# Patient Record
Sex: Male | Born: 1990 | Race: Black or African American | Hispanic: No | Marital: Single | Smoking: Current every day smoker
Health system: Southern US, Community
[De-identification: ages and names within clinical notes are randomized; demographics above are authoritative.]

## PROBLEM LIST (undated history)

## (undated) HISTORY — PX: SURGERY SCROTAL / TESTICULAR: SUR1316

## (undated) HISTORY — PX: TONSILLECTOMY: SUR1361

---

## 2016-02-16 ENCOUNTER — Emergency Department (HOSPITAL_COMMUNITY)
Admission: EM | Admit: 2016-02-16 | Discharge: 2016-02-16 | Disposition: A | Discharge status: Home / Self Care | Payer: Self-pay | Attending: Emergency Medicine | Admitting: Emergency Medicine

## 2016-02-16 ENCOUNTER — Encounter (HOSPITAL_COMMUNITY): Payer: Self-pay | Admitting: Emergency Medicine

## 2016-02-16 ENCOUNTER — Emergency Department (HOSPITAL_COMMUNITY): Payer: Self-pay

## 2016-02-16 DIAGNOSIS — N50819 Testicular pain, unspecified: Secondary | ICD-10-CM | POA: Insufficient documentation

## 2016-02-16 DIAGNOSIS — F17200 Nicotine dependence, unspecified, uncomplicated: Secondary | ICD-10-CM | POA: Insufficient documentation

## 2016-02-16 DIAGNOSIS — R52 Pain, unspecified: Secondary | ICD-10-CM

## 2016-02-16 DIAGNOSIS — Z9889 Other specified postprocedural states: Secondary | ICD-10-CM | POA: Insufficient documentation

## 2016-02-16 LAB — URINALYSIS, ROUTINE W REFLEX MICROSCOPIC
Bilirubin Urine: NEGATIVE
GLUCOSE, UA: NEGATIVE mg/dL
Hgb urine dipstick: NEGATIVE
KETONES UR: NEGATIVE mg/dL
LEUKOCYTES UA: NEGATIVE
Nitrite: NEGATIVE
PH: 7 (ref 5.0–8.0)
Protein, ur: NEGATIVE mg/dL
SPECIFIC GRAVITY, URINE: 1.03 (ref 1.005–1.030)

## 2016-02-16 NOTE — ED Notes (Signed)
Pt c/o sudden onset testicle pain since last night. Pt has hx of "testicles twisting" and has had surgery in the past. Pt A&Ox4 and ambulatory. Pt sts the pain comes and goes but when it comes it is excruciating. Pt c/o bilateral swelling and redness.

## 2016-02-16 NOTE — Discharge Instructions (Signed)
Return here for severe pain your testicles with associated scrotal edema, fever, or any other problems

## 2016-02-16 NOTE — ED Provider Notes (Signed)
CSN: [id number]     Arrival date & time 02/16/16  1718 History   First MD Initiated Contact with Patient 02/16/16 1915     Chief Complaint  Patient presents with  . Testicle Pain     (Consider location/radiation/quality/duration/timing/severity/associated sxs/prior Treatment) HPI Comments: Patient complains of intermittent testicular pain was last for minutes. 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 He is unsure which testicle was repaired. Denies any hematuria or dysuria. No fever or chills. Denies any current swelling. Symptoms occur spontaneously and last for several hours. No treatment use prior to arrival  Patient is a 25 y.o. male presenting with testicular pain. The history is provided by the patient.  Testicle Pain    History reviewed. No pertinent past medical history. Past Surgical History  Procedure Laterality Date  . Tonsillectomy     No family history on file. Social History  Substance Use Topics  . Smoking status: Current Every Day Smoker  . Smokeless tobacco: None  . Alcohol Use: Yes    Review of Systems  Genitourinary: Positive for testicular pain.  All other systems reviewed and are negative.     Allergies  Azithromycin  Home Medications   Prior to Admission medications   Not on File   BP 139/78 mmHg  Pulse 76  Temp(Src) 98.2 F (36.8 C) (Oral)  Resp 16  SpO2 100% Physical Exam  Constitutional: He is oriented to person, place, and time. He appears well-developed and well-nourished.  Non-toxic appearance. No distress.  HENT:  Head: Normocephalic and atraumatic.  Eyes: Conjunctivae, EOM and lids are normal. Pupils are equal, round, and reactive to light.  Neck: Normal range of motion. Neck supple. No tracheal deviation present. No thyroid mass present.  Cardiovascular: Normal rate, regular rhythm and normal heart sounds.  Exam reveals no gallop.   No murmur heard. Pulmonary/Chest: Effort normal and breath  sounds normal. No stridor. No respiratory distress. He has no decreased breath sounds. He has no wheezes. He has no rhonchi. He has no rales.  Abdominal: Soft. Normal appearance and bowel sounds are normal. He exhibits no distension. There is no tenderness. There is no rebound and no CVA tenderness.  Genitourinary: Testes normal. Cremasteric reflex is present. Uncircumcised.  Musculoskeletal: Normal range of motion. He exhibits no edema or tenderness.  Neurological: He is alert and oriented to person, place, and time. He has normal strength. No cranial nerve deficit or sensory deficit. GCS eye subscore is 4. GCS verbal subscore is 5. GCS motor subscore is 6.  Skin: Skin is warm and dry. No abrasion and no rash noted.  Psychiatric: He has a normal mood and affect. His speech is normal and behavior is normal.  Nursing note and vitals reviewed.   ED Course  Procedures (including critical care time) Labs Review Labs Reviewed  URINE CULTURE  URINALYSIS, ROUTINE W REFLEX MICROSCOPIC (NOT AT [facility])    Imaging Review No results found. I have personally reviewed and evaluated these images and lab results as part of my medical decision-making.   EKG Interpretation None      MDM   Final diagnoses:  None   patient's urinalysis is negative here. He has no evidence of torsion on physical exam or by testicular ultrasound. Will be given referral to urology    Lorre Nick, MD 02/16/16 2147

## 2016-02-16 NOTE — ED Notes (Signed)
MD at bedside. 

## 2016-02-16 NOTE — ED Notes (Signed)
Ultrasound at bedside

## 2016-02-18 LAB — URINE CULTURE: CULTURE: NO GROWTH

## 2016-07-14 ENCOUNTER — Emergency Department (HOSPITAL_COMMUNITY)
Admission: EM | Admit: 2016-07-14 | Discharge: 2016-07-14 | Disposition: A | Discharge status: Home / Self Care | Payer: Self-pay | Attending: Emergency Medicine | Admitting: Emergency Medicine

## 2016-07-14 ENCOUNTER — Encounter (HOSPITAL_COMMUNITY): Payer: Self-pay | Admitting: Emergency Medicine

## 2016-07-14 DIAGNOSIS — M79671 Pain in right foot: Secondary | ICD-10-CM | POA: Insufficient documentation

## 2016-07-14 DIAGNOSIS — R21 Rash and other nonspecific skin eruption: Secondary | ICD-10-CM | POA: Insufficient documentation

## 2016-07-14 DIAGNOSIS — F17200 Nicotine dependence, unspecified, uncomplicated: Secondary | ICD-10-CM | POA: Insufficient documentation

## 2016-07-14 MED ORDER — TRIAMCINOLONE ACETONIDE 0.1 % EX CREA: 1.0000 "application " | TOPICAL_CREAM | Freq: Two times a day (BID) | CUTANEOUS | 1 refills | Status: AC

## 2016-07-14 MED ORDER — NAPROXEN 250 MG PO TABS: 250.0000 mg | ORAL_TABLET | Freq: Two times a day (BID) | ORAL | 0 refills | Status: AC

## 2016-07-14 NOTE — ED Triage Notes (Signed)
Pt reports that he was walking through the woods last pm in flip flops and now has red bumps to top of foot as well as pain and pressure when he walks or moves toes. Pt denies injury. Pt is sleepy during assessment and mumbles through triage making it difficult to communicate. Pt is A&O and in NAD. VSS

## 2016-07-14 NOTE — ED Provider Notes (Signed)
[facility]-EMERGENCY DEPT Provider Note   CSN: [id number] Arrival date & time: 07/14/16  0623  First Provider Contact:  First MD Initiated Contact with Patient 07/14/16 [phone]        History   Chief Complaint Chief Complaint  Patient presents with  . Foot Pain    HPI Nathan Esparza is a 25 y.o. male.  Presents to the ED complaining of a rash and pain to the top of his right foot since yesterday. The patient reports he was walking in the woods yesterday and then noticed a red rash that is is painful overlying the top of his right foot. He denies any injury or trauma to his foot. He denies seeing any insects or bugs on his feet. She denies any rashes. He has had nothing for treatment of his symptoms today. He denies fevers, swelling, trauma, falls, numbness, tingling or weakness.   The history is provided by the patient. No language interpreter was used.  Foot Pain  Associated symptoms include arthralgias and a rash. Pertinent negatives include no fever, joint swelling, numbness or weakness.    History reviewed. No pertinent past medical history.  There are no active problems to display for this patient.   Past Surgical History:  Procedure Laterality Date  . SURGERY SCROTAL / TESTICULAR    . TONSILLECTOMY         Home Medications    Prior to Admission medications   Medication Sig Start Date End Date Taking? Authorizing Provider  naproxen (NAPROSYN) 250 MG tablet Take 1 tablet (250 mg total) by mouth 2 (two) times daily with a meal. As needed for pain. 07/14/16   Everlene Farrier, PA-C  triamcinolone cream (KENALOG) 0.1 % Apply 1 application topically 2 (two) times daily. 07/14/16   Everlene Farrier, PA-C    Family History No family history on file.  Social History Social History  Substance Use Topics  . Smoking status: Current Every Day Smoker  . Smokeless tobacco: Never Used  . Alcohol use Yes     Allergies   Azithromycin   Review of Systems Review of Systems    Constitutional: Negative for fever.  Cardiovascular: Negative for leg swelling.  Musculoskeletal: Positive for arthralgias. Negative for gait problem and joint swelling.  Skin: Positive for rash. Negative for pallor and wound.  Neurological: Negative for weakness and numbness.     Physical Exam Updated Vital Signs BP 120/77 (BP Location: Left Arm)   Pulse 85   Temp 97.9 F (36.6 C) (Oral)   Resp 16   SpO2 98%   Physical Exam  Constitutional: He appears well-developed and well-nourished. No distress.  HENT:  Head: Normocephalic and atraumatic.  Eyes: Right eye exhibits no discharge. Left eye exhibits no discharge.  Cardiovascular: Normal rate, regular rhythm and intact distal pulses.   Bilateral dorsalis pedis and posterior tibialis pulses are intact. Good capillary refill.  Pulmonary/Chest: Effort normal. No respiratory distress.  Musculoskeletal: Normal range of motion. He exhibits tenderness. He exhibits no edema or deformity.  Patient is very mild erythema overlying the dorsum of his right foot. There is mild tenderness to palpation. No bony point tenderness. No deformity. Good range of motion of his ankle and toes. No calf edema or tenderness. No vesicles or bulla. No fluctuance or discharge. No evidence of abscess. No evidence of cellulitis. No warmth.  Neurological: He is alert. Coordination normal.  Skin: Skin is warm and dry. Capillary refill takes less than 2 seconds. He is not diaphoretic. No pallor.  See musculoskeletal  Psychiatric: He has a normal mood and affect. His behavior is normal.  Nursing note and vitals reviewed.    ED Treatments / Results  Labs (all labs ordered are listed, but only abnormal results are displayed) Labs Reviewed - No data to display  EKG  EKG Interpretation None       Radiology No results found.  Procedures Procedures (including critical care time)  Medications Ordered in ED Medications - No data to display   Initial  Impression / Assessment and Plan / ED Course  I have reviewed the triage vital signs and the nursing notes.    Clinical Course   Patient presented  complaining of a rash and pain to the top of his right foot since yesterday. The patient reports he was walking in the woods yesterday and then noticed a red rash that is is painful overlying the top of his right foot. He denies any injury or trauma to his foot. He denies seeing any insects or bugs on his feet. She denies any rashes. On examination patient is afebrile nontoxic appearing. His slight erythema overlying the dorsum of his right foot. There is mild tenderness. No vesicles or bulla. No evidence of cellulitis or warmth. He was walking without closed toed shoes in the woods. This possibly from an insect bite or poison ivy. Will start on steroid cream naproxen for pain control. I discussed to watch for signs of cellulitis and discussed these in symptoms. Also encouraged him follow-up with primary care for recheck. I advised the patient to follow-up with their primary care provider this week. I advised the patient to return to the emergency department with new or worsening symptoms or new concerns. The patient verbalized understanding and agreement with plan.    Final Clinical Impressions(s) / ED Diagnoses   Final diagnoses:  Rash and nonspecific skin eruption    New Prescriptions New Prescriptions   NAPROXEN (NAPROSYN) 250 MG TABLET    Take 1 tablet (250 mg total) by mouth 2 (two) times daily with a meal. As needed for pain.   TRIAMCINOLONE CREAM (KENALOG) 0.1 %    Apply 1 application topically 2 (two) times daily.     Everlene Farrier, PA-C 07/14/16 8295    Nelva Nay, MD 07/14/16 [phone]

## 2017-02-26 ENCOUNTER — Encounter (HOSPITAL_COMMUNITY): Payer: Self-pay | Admitting: Nurse Practitioner

## 2017-02-26 ENCOUNTER — Emergency Department (HOSPITAL_COMMUNITY)
Admission: EM | Admit: 2017-02-26 | Discharge: 2017-02-27 | Disposition: A | Discharge status: Home / Self Care | Payer: Self-pay | Attending: Emergency Medicine | Admitting: Emergency Medicine

## 2017-02-26 ENCOUNTER — Emergency Department (HOSPITAL_COMMUNITY): Payer: Self-pay

## 2017-02-26 DIAGNOSIS — F17200 Nicotine dependence, unspecified, uncomplicated: Secondary | ICD-10-CM | POA: Insufficient documentation

## 2017-02-26 DIAGNOSIS — R945 Abnormal results of liver function studies: Secondary | ICD-10-CM | POA: Insufficient documentation

## 2017-02-26 DIAGNOSIS — N50819 Testicular pain, unspecified: Secondary | ICD-10-CM

## 2017-02-26 DIAGNOSIS — R748 Abnormal levels of other serum enzymes: Secondary | ICD-10-CM

## 2017-02-26 DIAGNOSIS — N50811 Right testicular pain: Secondary | ICD-10-CM | POA: Insufficient documentation

## 2017-02-26 DIAGNOSIS — N50812 Left testicular pain: Secondary | ICD-10-CM | POA: Insufficient documentation

## 2017-02-26 LAB — CBC WITH DIFFERENTIAL/PLATELET
BASOS ABS: 0.1 10*3/uL (ref 0.0–0.1)
BASOS PCT: 1 %
Eosinophils Absolute: 0 10*3/uL (ref 0.0–0.7)
Eosinophils Relative: 1 %
HEMATOCRIT: 40.2 % (ref 39.0–52.0)
HEMOGLOBIN: 14 g/dL (ref 13.0–17.0)
Lymphocytes Relative: 32 %
Lymphs Abs: 2.7 10*3/uL (ref 0.7–4.0)
MCH: 30.2 pg (ref 26.0–34.0)
MCHC: 34.8 g/dL (ref 30.0–36.0)
MCV: 86.8 fL (ref 78.0–100.0)
MONOS PCT: 7 %
Monocytes Absolute: 0.6 10*3/uL (ref 0.1–1.0)
NEUTROS ABS: 5.1 10*3/uL (ref 1.7–7.7)
NEUTROS PCT: 59 %
Platelets: 364 10*3/uL (ref 150–400)
RBC: 4.63 MIL/uL (ref 4.22–5.81)
RDW: 13 % (ref 11.5–15.5)
WBC: 8.5 10*3/uL (ref 4.0–10.5)

## 2017-02-26 LAB — URINALYSIS, ROUTINE W REFLEX MICROSCOPIC
BACTERIA UA: NONE SEEN
BILIRUBIN URINE: NEGATIVE
Glucose, UA: NEGATIVE mg/dL
HGB URINE DIPSTICK: NEGATIVE
Ketones, ur: NEGATIVE mg/dL
LEUKOCYTES UA: NEGATIVE
NITRITE: NEGATIVE
PROTEIN: 30 mg/dL — AB
SQUAMOUS EPITHELIAL / LPF: NONE SEEN
Specific Gravity, Urine: 1.025 (ref 1.005–1.030)
pH: 8 (ref 5.0–8.0)

## 2017-02-26 LAB — I-STAT CG4 LACTIC ACID, ED: LACTIC ACID, VENOUS: 2.01 mmol/L — AB (ref 0.5–1.9)

## 2017-02-26 MED ORDER — ONDANSETRON HCL 4 MG/2ML IJ SOLN
4.0000 mg | Freq: Once | INTRAMUSCULAR | Status: AC
  Administered 2017-02-26: 4 mg via INTRAVENOUS
  Filled 2017-02-26: qty 2

## 2017-02-26 MED ORDER — MORPHINE SULFATE (PF) 4 MG/ML IV SOLN
4.0000 mg | Freq: Once | INTRAVENOUS | Status: AC
  Administered 2017-02-26: 4 mg via INTRAVENOUS
  Filled 2017-02-26: qty 1

## 2017-02-26 NOTE — ED Provider Notes (Signed)
[facility]-EMERGENCY DEPT Provider Note   CSN: [id number] Arrival date & time: 02/26/17  2041     History   Chief Complaint Chief Complaint  Patient presents with  . Testicle Pain    HPI Nathan Esparza is a 26 y.o. male with a past medical history significant for testicular torsion status post repair who presents with groin pain. Patient reports that he had onset of bilateral testicle pain approximate 30 minutes prior to arrival. He describes the pain as 8 out of 10 severity, located in both testicles, and is a pulling/tugging pain. He denies penile discharge, dysuria, or difficulty with urination. He denies any difficulty or pain with bowel movements or ejaculation. He reports some mild chronic constipation. He reports some nausea with the significant pain. He says that his testicles are not swollen or have a change in color. He denies any ulcers. He does report a remote history of sexually transmitted infection years ago. He denies any recent groin trauma or other complaints today. No fevers or chills.  No history of hernias.   HPI  History reviewed. No pertinent past medical history.  There are no active problems to display for this patient.   Past Surgical History:  Procedure Laterality Date  . SURGERY SCROTAL / TESTICULAR    . TONSILLECTOMY         Home Medications    Prior to Admission medications   Medication Sig Start Date End Date Taking? Authorizing Provider  ibuprofen (ADVIL,MOTRIN) 200 MG tablet Take 400 mg by mouth every 6 (six) hours as needed for moderate pain.   Yes Historical Provider, MD  naproxen (NAPROSYN) 250 MG tablet Take 1 tablet (250 mg total) by mouth 2 (two) times daily with a meal. As needed for pain. Patient not taking: Reported on 02/26/2017 07/14/16   Everlene Farrier, PA-C  triamcinolone cream (KENALOG) 0.1 % Apply 1 application topically 2 (two) times daily. Patient not taking: Reported on 02/26/2017 07/14/16   Everlene Farrier, PA-C    Family  History History reviewed. No pertinent family history.  Social History Social History  Substance Use Topics  . Smoking status: Current Every Day Smoker    Packs/day: 0.50  . Smokeless tobacco: Never Used  . Alcohol use 2.4 - 25.2 oz/week    1 - 2 Shots of liquor, 3 - 40 Cans of beer per week     Comment: last shot of liquor today and was one shot      Allergies   Azithromycin   Review of Systems Review of Systems  Constitutional: Negative for activity change, chills, diaphoresis, fatigue and fever.  HENT: Negative for congestion and rhinorrhea.   Eyes: Negative for visual disturbance.  Respiratory: Negative for cough, chest tightness, shortness of breath, wheezing and stridor.   Cardiovascular: Negative for chest pain, palpitations and leg swelling.  Gastrointestinal: Negative for abdominal distention, abdominal pain, blood in stool, constipation, diarrhea, nausea and vomiting.  Genitourinary: Positive for genital sores and testicular pain. Negative for decreased urine volume, difficulty urinating, discharge, dysuria, flank pain, hematuria, penile pain, penile swelling, scrotal swelling and urgency.  Musculoskeletal: Negative for back pain, gait problem, neck pain and neck stiffness.  Skin: Negative for rash and wound.  Neurological: Negative for dizziness, weakness, light-headedness and headaches.  Psychiatric/Behavioral: Negative for agitation and confusion.  All other systems reviewed and are negative.    Physical Exam Updated Vital Signs BP 141/82 (BP Location: Right Arm)   Pulse 72   Temp 98.7 F (37.1 C) (Oral)  Resp 18   Ht 5\' 9"  (1.753 m)   Wt 150 lb (68 kg)   SpO2 100%   BMI 22.15 kg/m   Physical Exam  Constitutional: He is oriented to person, place, and time. He appears well-developed and well-nourished. No distress.  HENT:  Head: Normocephalic and atraumatic.  Right Ear: External ear normal.  Left Ear: External ear normal.  Nose: Nose normal.   Mouth/Throat: Oropharynx is clear and moist. No oropharyngeal exudate.  Eyes: Conjunctivae and EOM are normal. Pupils are equal, round, and reactive to light.  Neck: Normal range of motion. Neck supple.  Cardiovascular: Normal rate, normal heart sounds and intact distal pulses.   No murmur heard. Pulmonary/Chest: Effort normal. No stridor. No respiratory distress. He has no wheezes. He exhibits no tenderness.  Abdominal: Soft. There is no tenderness. There is no rebound and no guarding. Hernia confirmed negative in the right inguinal area and confirmed negative in the left inguinal area.  Genitourinary: Penis normal. Right testis shows tenderness. Right testis is descended. Cremasteric reflex is not absent on the right side. Left testis shows tenderness. Left testis is descended. Cremasteric reflex is not absent on the left side. Circumcised. No penile tenderness. No discharge found.     Musculoskeletal: He exhibits no edema or tenderness.  Lymphadenopathy: No inguinal adenopathy noted on the right or left side.  Neurological: He is alert and oriented to person, place, and time. No cranial nerve deficit or sensory deficit. He exhibits normal muscle tone.  Skin: Skin is warm. Capillary refill takes less than 2 seconds. No rash noted. He is not diaphoretic. No erythema. No pallor.  Nursing note and vitals reviewed.    ED Treatments / Results  Labs (all labs ordered are listed, but only abnormal results are displayed) Labs Reviewed  COMPREHENSIVE METABOLIC PANEL - Abnormal; Notable for the following:       Result Value   Total Protein 8.5 (*)    AST 255 (*)    ALT 111 (*)    All other components within normal limits  URINALYSIS, ROUTINE W REFLEX MICROSCOPIC - Abnormal; Notable for the following:    Protein, ur 30 (*)    All other components within normal limits  I-STAT CG4 LACTIC ACID, ED - Abnormal; Notable for the following:    Lactic Acid, Venous 2.01 (*)    All other components  within normal limits  URINE CULTURE  CBC WITH DIFFERENTIAL/PLATELET  RPR  HIV ANTIBODY (ROUTINE TESTING)  GC/CHLAMYDIA PROBE AMP ([place]) NOT AT [facility]    EKG  EKG Interpretation None       Radiology US Scrotum  Result Date: 02/26/2017 CLINICAL DATA:  Initial evaluation for acute onset severe testicular pain. EXAM: ULTRASOUND OF SCROTUM TECHNIQUE: Complete ultrasound examination of the testicles, epididymis, and other scrotal structures was performed. COMPARISON:  None available. FINDINGS: Right testicle Measurements: 4.4 x 2.4 x 3.1 cm. No mass or microlithiasis visualized. Left testicle Measurements: 4.1 x 2.2 x 2.8 cm. No mass or microlithiasis visualized. Right epididymis:  Normal in size and appearance.  The Left epididymis:  Normal in size and appearance. Hydrocele:  None visualized. Varicocele:  None visualized. IMPRESSION: Negative. No evidence for testicular mass or other significant abnormality. Electronically Signed   By: Rise Mu M.D.   On: 02/26/2017 23:10   [place] Art/ven Flow Abd Pelv Doppler  Result Date: 02/26/2017 CLINICAL DATA:  Initial evaluation for acute onset severe testicular pain. EXAM: ULTRASOUND OF SCROTUM TECHNIQUE: Complete ultrasound examination of  the testicles, epididymis, and other scrotal structures was performed. COMPARISON:  None available. FINDINGS: Right testicle Measurements: 4.4 x 2.4 x 3.1 cm. No mass or microlithiasis visualized. Left testicle Measurements: 4.1 x 2.2 x 2.8 cm. No mass or microlithiasis visualized. Right epididymis:  Normal in size and appearance.  The Left epididymis:  Normal in size and appearance. Hydrocele:  None visualized. Varicocele:  None visualized. IMPRESSION: Negative. No evidence for testicular mass or other significant abnormality. Electronically Signed   By: Rise MuBenjamin  McClintock M.D.   On: 02/26/2017 23:10    Procedures Procedures (including critical care time)  Medications Ordered in ED Medications   morphine 4 MG/ML injection 4 mg (4 mg Intravenous Given 02/26/17 2207)  ondansetron (ZOFRAN) injection 4 mg (4 mg Intravenous Given 02/26/17 2202)  morphine 4 MG/ML injection 4 mg (4 mg Intravenous Given 02/26/17 2332)     Initial Impression / Assessment and Plan / ED Course  I have reviewed the triage vital signs and the nursing notes.  Pertinent labs & imaging results that were available during my care of the patient were reviewed by me and considered in my medical decision making (see chart for details).     Nathan Esparza is a 26 y.o. male with a past medical history significant for testicular torsion status post repair who presents with groin pain.   History and exam are seen above.  On exam, patient had cremasteric reflex bilaterally. No penile pain or drainage. No abnormalities on GU exam aside from mild tenderness of the testicles. No hernias appreciated. No inguinal lymphadenopathy. A chaperone was present for all GU exam. Patient deferred rectal exam to check prostate.  Given the history of torsion and symptoms, patient had ultrasound to look for torsion or other abnormality. Imaging negative for significant abnormality including no evidence of epididymitis or other abnormality on ultrasound. Laboratory testing showed no evidence of urinary tract infection with no leukocytes. CMP showed elevated AST ALT. Patient reports that he drank several shots of alcohol this afternoon prior to coming in due to the pain. Patient informed that this may be the cause of his liver function around. Patient encouraged to follow-up with a PCP in several days for recheck.  CBC showed no significant abnormalities in lactic acid slightly elevated at 2.01. Patient able to tolerate oral fluids.  Patient requesting STI check. Patient had GC, RPR, and HIV sent. Patient will follow-up with PCP for these results.  Given reassuring ultrasound and improvement in symptoms after pain medications, patient will be  discharged with pain medication. Patient also felt better with scrotal support. Patient encouraged to use a scrotal support device.   Patient will follow-up with PCP for further management. Patient should return precautions for any new or worsening symptoms. Patient discharged in good condition with improvement in symptoms.     Final Clinical Impressions(s) / ED Diagnoses   Final diagnoses:  Testicle pain  Elevated liver enzymes    New Prescriptions Discharge Medication List as of 02/27/2017  1:19 AM    START taking these medications   Details  ondansetron (ZOFRAN) 4 MG tablet Take 1 tablet (4 mg total) by mouth every 8 (eight) hours as needed for nausea or vomiting., Starting Thu 02/27/2017, Print    oxyCODONE (ROXICODONE) 5 MG immediate release tablet Take 1 tablet (5 mg total) by mouth every 4 (four) hours as needed for severe pain., Starting Thu 02/27/2017, Print        Clinical Impression: 1. Testicle pain   2. Elevated  liver enzymes     Disposition: Discharge  Condition: Good  I have discussed the results, Dx and Tx plan with the pt(& family if present). He/she/they expressed understanding and agree(s) with the plan. Discharge instructions discussed at great length. Strict return precautions discussed and pt &/or family have verbalized understanding of the instructions. No further questions at time of discharge.    Discharge Medication List as of 02/27/2017  1:19 AM    START taking these medications   Details  ondansetron (ZOFRAN) 4 MG tablet Take 1 tablet (4 mg total) by mouth every 8 (eight) hours as needed for nausea or vomiting., Starting Thu 02/27/2017, Print    oxyCODONE (ROXICODONE) 5 MG immediate release tablet Take 1 tablet (5 mg total) by mouth every 4 (four) hours as needed for severe pain., Starting Thu 02/27/2017, Print        Follow Up: [facility] [address] [place] [address] [phone] Schedule  an appointment as soon as possible for a visit    [facility] [address] [phone] mc [place] [address] [phone]  If symptoms worsen     Heide Scales, MD 02/27/17 [phone]

## 2017-02-26 NOTE — ED Triage Notes (Signed)
Pt called EMS from home with c/o testicular pain  Pt states it started about 30 minutes ago  No swelling noted  Describes pain as a tugging or pulling  Pt has hx of testicular torsion at age 26 that required surgery  Pt got out of jail today at 2 pm

## 2017-02-27 LAB — COMPREHENSIVE METABOLIC PANEL
ALBUMIN: 4.9 g/dL (ref 3.5–5.0)
ALK PHOS: 74 U/L (ref 38–126)
ALT: 111 U/L — ABNORMAL HIGH (ref 17–63)
ANION GAP: 7 (ref 5–15)
AST: 255 U/L — ABNORMAL HIGH (ref 15–41)
BUN: 12 mg/dL (ref 6–20)
CO2: 26 mmol/L (ref 22–32)
Calcium: 10.1 mg/dL (ref 8.9–10.3)
Chloride: 110 mmol/L (ref 101–111)
Creatinine, Ser: 0.91 mg/dL (ref 0.61–1.24)
GFR calc Af Amer: >60 mL/min (ref >60)
GFR calc non Af Amer: >60 mL/min (ref >60)
GLUCOSE: 89 mg/dL (ref 65–99)
POTASSIUM: 4.3 mmol/L (ref 3.5–5.1)
SODIUM: 143 mmol/L (ref 135–145)
Total Bilirubin: 1 mg/dL (ref 0.3–1.2)
Total Protein: 8.5 g/dL — ABNORMAL HIGH (ref 6.5–8.1)

## 2017-02-27 MED ORDER — OXYCODONE HCL 5 MG PO TABS: 5.0000 mg | ORAL_TABLET | ORAL | 0 refills | Status: AC | PRN

## 2017-02-27 MED ORDER — ONDANSETRON HCL 4 MG PO TABS: 4.0000 mg | ORAL_TABLET | Freq: Three times a day (TID) | ORAL | 0 refills | Status: AC | PRN

## 2017-02-27 NOTE — Discharge Instructions (Signed)
Please schedule an appointment with a primary care physician for further management of your symptoms as well as to get the results of your lab testing. Please have them recheck your liver function testing on your next visit. Please take the pain and nausea medicine as needed to treat your symptoms. If any symptoms acutely worsen, please return to the nearest emergency department.

## 2017-02-28 LAB — URINE CULTURE: Culture: NO GROWTH

## 2017-02-28 LAB — GC/CHLAMYDIA PROBE AMP (~~LOC~~) NOT AT ARMC
CHLAMYDIA, DNA PROBE: NEGATIVE
NEISSERIA GONORRHEA: NEGATIVE

## 2017-02-28 LAB — HIV ANTIBODY (ROUTINE TESTING W REFLEX): HIV SCREEN 4TH GENERATION: NONREACTIVE

## 2017-02-28 LAB — RPR: RPR: NONREACTIVE

## 2017-08-10 ENCOUNTER — Encounter (HOSPITAL_COMMUNITY): Payer: Self-pay | Admitting: Emergency Medicine

## 2017-08-10 ENCOUNTER — Emergency Department (HOSPITAL_COMMUNITY): Payer: Self-pay

## 2017-08-10 ENCOUNTER — Emergency Department (HOSPITAL_COMMUNITY)
Admission: EM | Admit: 2017-08-10 | Discharge: 2017-08-10 | Disposition: A | Discharge status: Home / Self Care | Payer: Self-pay | Attending: Emergency Medicine | Admitting: Emergency Medicine

## 2017-08-10 DIAGNOSIS — N50819 Testicular pain, unspecified: Secondary | ICD-10-CM

## 2017-08-10 DIAGNOSIS — N50812 Left testicular pain: Secondary | ICD-10-CM | POA: Insufficient documentation

## 2017-08-10 DIAGNOSIS — F17210 Nicotine dependence, cigarettes, uncomplicated: Secondary | ICD-10-CM | POA: Insufficient documentation

## 2017-08-10 DIAGNOSIS — N503 Cyst of epididymis: Secondary | ICD-10-CM | POA: Insufficient documentation

## 2017-08-10 LAB — URINALYSIS, ROUTINE W REFLEX MICROSCOPIC
BILIRUBIN URINE: NEGATIVE
Bacteria, UA: NONE SEEN
GLUCOSE, UA: NEGATIVE mg/dL
Hgb urine dipstick: NEGATIVE
KETONES UR: NEGATIVE mg/dL
Leukocytes, UA: NEGATIVE
Nitrite: NEGATIVE
PROTEIN: 30 mg/dL — AB
Specific Gravity, Urine: 1.025 (ref 1.005–1.030)
pH: 6 (ref 5.0–8.0)

## 2017-08-10 MED ORDER — MORPHINE SULFATE (PF) 4 MG/ML IV SOLN
4.0000 mg | Freq: Once | INTRAVENOUS | Status: AC
  Administered 2017-08-10: 4 mg via INTRAVENOUS
  Filled 2017-08-10: qty 1

## 2017-08-10 MED ORDER — ONDANSETRON HCL 4 MG/2ML IJ SOLN
4.0000 mg | Freq: Once | INTRAMUSCULAR | Status: AC
  Administered 2017-08-10: 4 mg via INTRAVENOUS
  Filled 2017-08-10: qty 2

## 2017-08-10 MED ORDER — MELOXICAM 15 MG PO TABS: 15.0000 mg | ORAL_TABLET | Freq: Every day | ORAL | 0 refills | Status: AC

## 2017-08-10 NOTE — ED Triage Notes (Addendum)
Pt comes in with complaints of bilateral testicle pain that started an hour ago.  Pt had testicular torsion when he was 17 and had to have surgery.  Pt states he has had issues since then that they have been able to resolve with medication.  States he wants a referral to a pain management clinic if this continues. Denies swelling.

## 2017-08-10 NOTE — Discharge Instructions (Signed)
Take mobic as prescribed for pain and inflammation. Elevate and support scrotum. Follow up with urology as referred.

## 2017-08-10 NOTE — ED Provider Notes (Signed)
[facility]-EMERGENCY DEPT Provider Note   CSN: [id number] Arrival date & time: 08/10/17  2032     History   Chief Complaint Chief Complaint  Patient presents with  . Testicle Pain    HPI Nathan Esparza is a 26 y.o. male.  HPI Nathan Esparza is a 26 y.o. malewith history of testicular torsion at age 64, with surgical repair, unsure which side, presents to emergency department with sudden onset of left testicular pain. Patient states he was "chilling" when pain began. He denies any strenuous activity or injury. He states acute pain started approximately an hour ago. She denies associated abdominal pain. No nausea or vomiting. No urinary symptoms. He is having normal bowel movements. Denies any penile discharge or lesions. He states that since his surgery this has happened twice before in each time he was told his exam and ultrasounds were fine. He took some Tylenol prior to coming in but that did not help pain. Patient states his pain is 10 out of 10. nothing seems to be making pain better or worse.  History reviewed. No pertinent past medical history.  There are no active problems to display for this patient.   Past Surgical History:  Procedure Laterality Date  . SURGERY SCROTAL / TESTICULAR    . TONSILLECTOMY         Home Medications    Prior to Admission medications   Medication Sig Start Date End Date Taking? Authorizing Provider  ibuprofen (ADVIL,MOTRIN) 200 MG tablet Take 400 mg by mouth every 6 (six) hours as needed for moderate pain.    [provider]  naproxen (NAPROSYN) 250 MG tablet Take 1 tablet (250 mg total) by mouth 2 (two) times daily with a meal. As needed for pain. Patient not taking: Reported on 02/26/2017 07/14/16   Everlene Farrier, PA-C  ondansetron (ZOFRAN) 4 MG tablet Take 1 tablet (4 mg total) by mouth every 8 (eight) hours as needed for nausea or vomiting. 02/27/17   Tegeler, Canary Brim, MD  oxyCODONE (ROXICODONE) 5 MG immediate release tablet  Take 1 tablet (5 mg total) by mouth every 4 (four) hours as needed for severe pain. 02/27/17   Tegeler, Canary Brim, MD  triamcinolone cream (KENALOG) 0.1 % Apply 1 application topically 2 (two) times daily. Patient not taking: Reported on 02/26/2017 07/14/16   Everlene Farrier, PA-C    Family History No family history on file.  Social History Social History  Substance Use Topics  . Smoking status: Current Every Day Smoker    Packs/day: 0.50  . Smokeless tobacco: Never Used  . Alcohol use 2.4 - 25.2 oz/week    1 - 2 Shots of liquor, 3 - 40 Cans of beer per week     Comment: last shot of liquor today and was one shot      Allergies   Azithromycin   Review of Systems Review of Systems  Constitutional: Negative for chills and fever.  Respiratory: Negative for cough, chest tightness and shortness of breath.   Cardiovascular: Negative for chest pain, palpitations and leg swelling.  Gastrointestinal: Negative for abdominal distention, abdominal pain, diarrhea, nausea and vomiting.  Genitourinary: Positive for scrotal swelling and testicular pain. Negative for dysuria, frequency, hematuria, penile pain and urgency.  Musculoskeletal: Negative for arthralgias, myalgias, neck pain and neck stiffness.  Skin: Negative for rash.  Allergic/Immunologic: Negative for immunocompromised state.  Neurological: Negative for dizziness, weakness, light-headedness, numbness and headaches.  All other systems reviewed and are negative.    Physical Exam Updated  Vital Signs BP (!) 128/54 (BP Location: Left Arm)   Pulse 76   Temp 98.6 F (37 C) (Oral)   Resp 18   Ht 5\' 9"  (1.753 m)   Wt 68 kg (150 lb)   SpO2 100%   BMI 22.15 kg/m   Physical Exam  Constitutional: He appears well-developed and well-nourished. No distress.  HENT:  Head: Normocephalic and atraumatic.  Eyes: Conjunctivae are normal.  Neck: Neck supple.  Cardiovascular: Normal rate, regular rhythm and normal heart sounds.     Pulmonary/Chest: Effort normal. No respiratory distress. He has no wheezes. He has no rales.  Abdominal: Soft. Bowel sounds are normal. He exhibits no distension. There is no tenderness. There is no rebound.  Genitourinary:  Genitourinary Comments: Slightly enlarged left testicle, diffuse tenderness to palpation. No hernia present. Normal cremasteric reflex.  Normal penis.  Musculoskeletal: He exhibits no edema.  Neurological: He is alert.  Skin: Skin is warm and dry.  Nursing note and vitals reviewed.    ED Treatments / Results  Labs (all labs ordered are listed, but only abnormal results are displayed) Labs Reviewed  URINALYSIS, ROUTINE W REFLEX MICROSCOPIC  GC/CHLAMYDIA PROBE AMP ([place]) NOT AT [facility]    EKG  EKG Interpretation None       Radiology No results found.  Procedures Procedures (including critical care time)  Medications Ordered in ED Medications  morphine 4 MG/ML injection 4 mg (not administered)  ondansetron (ZOFRAN) injection 4 mg (not administered)     Initial Impression / Assessment and Plan / ED Course  I have reviewed the triage vital signs and the nursing notes.  Pertinent labs & imaging results that were available during my care of the patient were reviewed by me and considered in my medical decision making (see chart for details).     Patient with acute onset of recurrent left testicular pain. History of testicular torsion at age 30 with surgical repair. Patient is unsure what side it was on. Exam is concerning for testicular torsion although he reports he has had 2 similar episodes in the past with unremarkable ultrasound.Went ahead and ordered him some pain medications, ultrasound, urinalysis, GC chlamydia.   Urinalysis unremarkable. Ultrasound showing small epididymal cyst in the left scrotum. Patient feeling better with pain medication. I will refer patient to urology given he had an proportional amount of pain given the finding. And  this is a recurrent issue. Advised to follow-up as soon as possible. Otherwise NSAIDs, scrotal support, elevation. Gonorrhea and Chlamydia pending  Vitals:   08/10/17 2056 08/10/17 2102 08/10/17 2141  BP: (!) 128/54  117/64  Pulse: 76  72  Resp: 18  16  Temp: 98.6 F (37 C)    TempSrc: Oral    SpO2: 100%  100%  Weight:  68 kg (150 lb)   Height:  5\' 9"  (1.753 m)     Final Clinical Impressions(s) / ED Diagnoses   Final diagnoses:  Testicular pain  Testicular pain    New Prescriptions New Prescriptions   No medications on file     Jaynie Crumble, Cordelia Poche 08/13/17 [id number]    Geoffery Lyons, MD 08/13/17 1112

## 2017-10-19 IMAGING — US US ART/VEN ABD/PELV/SCROTUM DOPPLER LTD
1 series · 13 of 25 positions shown · non-contrast
Comparison: None.

CLINICAL DATA: Sudden onset of bilateral scrotal swelling and
redness last night, left greater than right. Patient reports prior
in detorsion surgery.

EXAM:
SCROTAL ULTRASOUND
DOPPLER ULTRASOUND OF THE TESTICLES
TECHNIQUE: Complete ultrasound examination of the testicles, epididymis, and
other scrotal structures was performed. Color and spectral Doppler
ultrasound were also utilized to evaluate blood flow to the
testicles.

[Series 1: us art/ven abd/pelv/scrotum doppler ltd · 0.06mm/px · 13 of 68 slices shown]
[im 1/68]
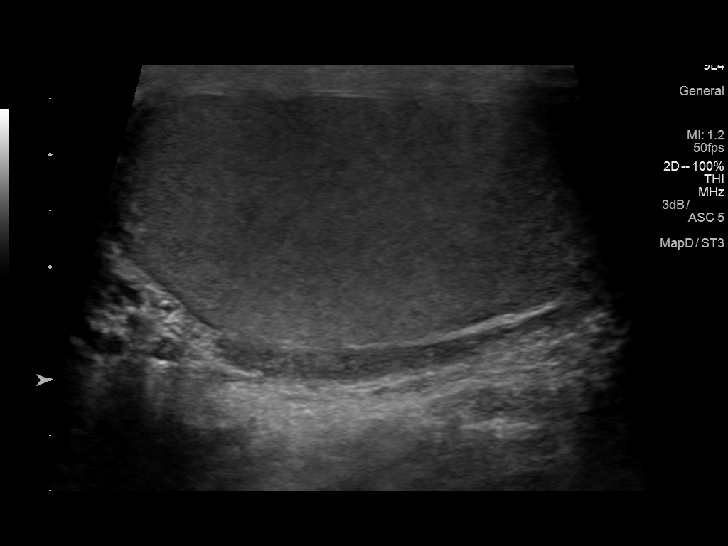
[im 6/68]
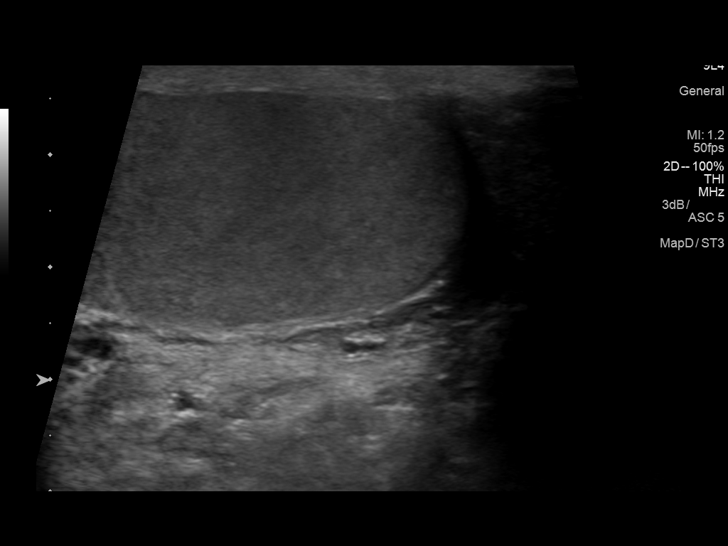
[im 12/68]
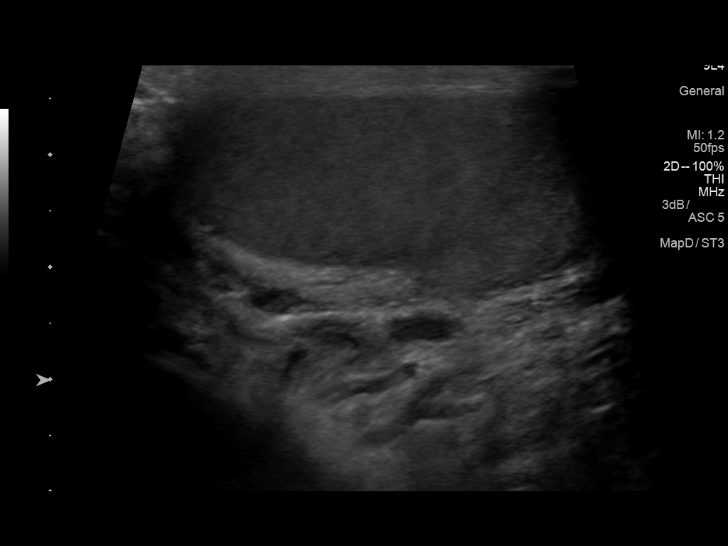
[im 17/68]
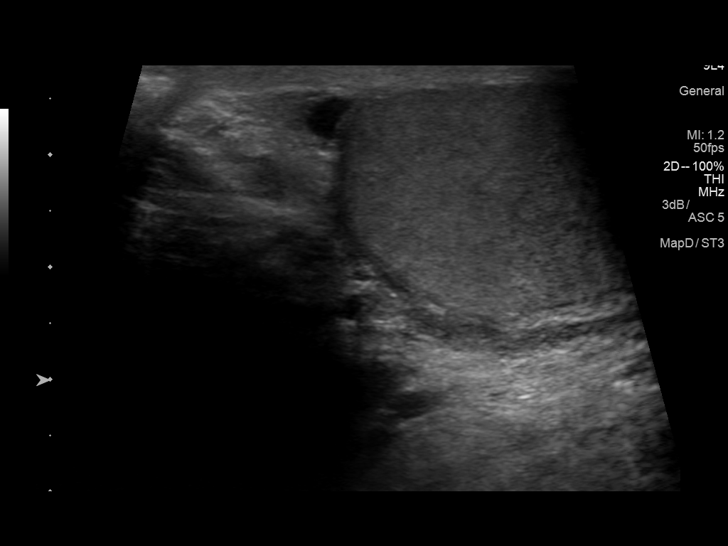
[im 23/68]
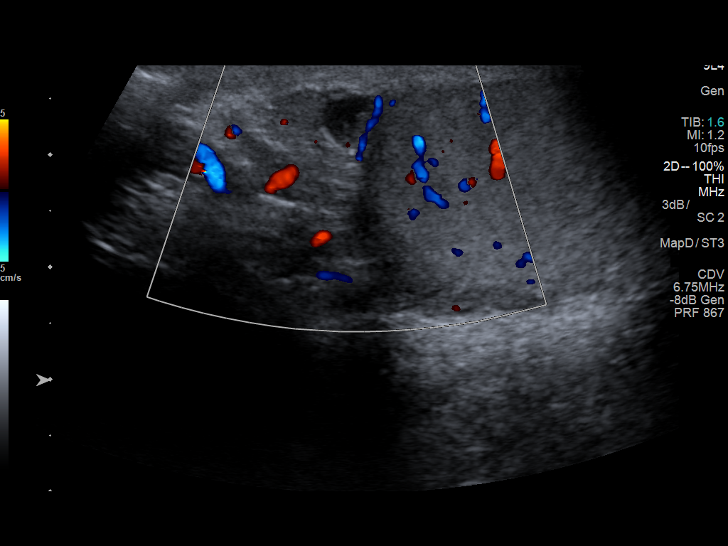
[im 28/68]
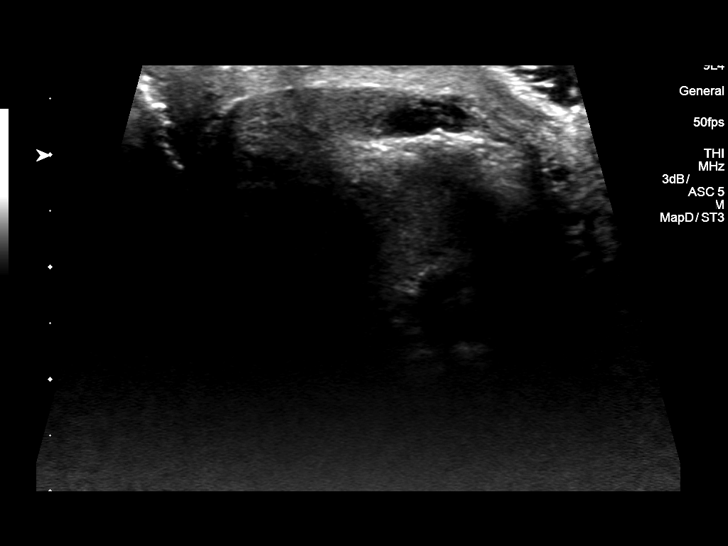
[im 34/68]
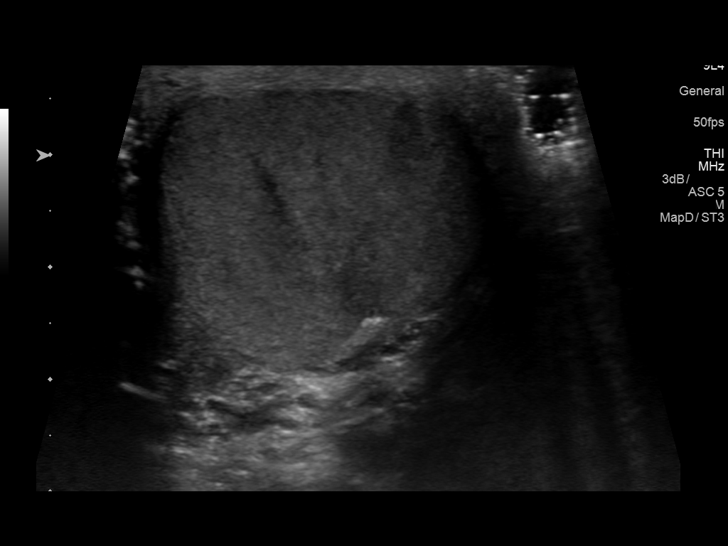
[im 40/68]
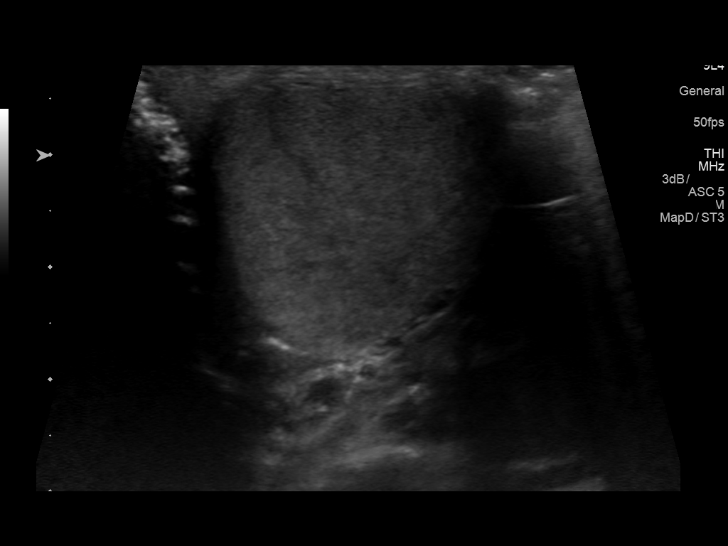
[im 45/68]
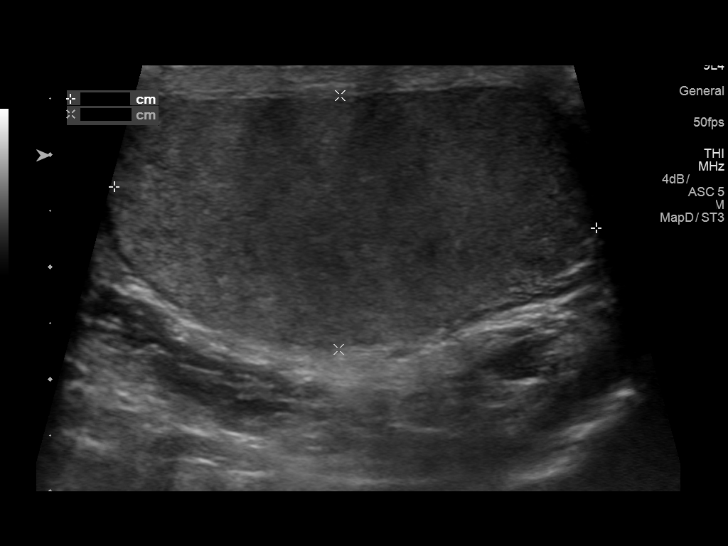
[im 51/68]
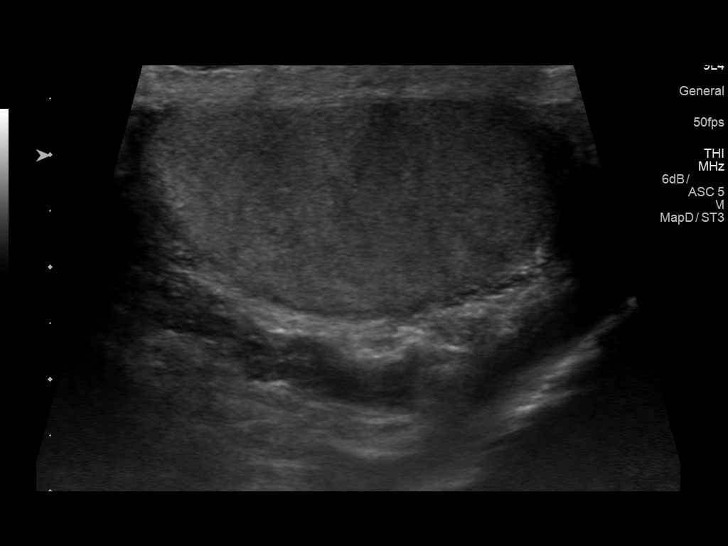
[im 56/68]
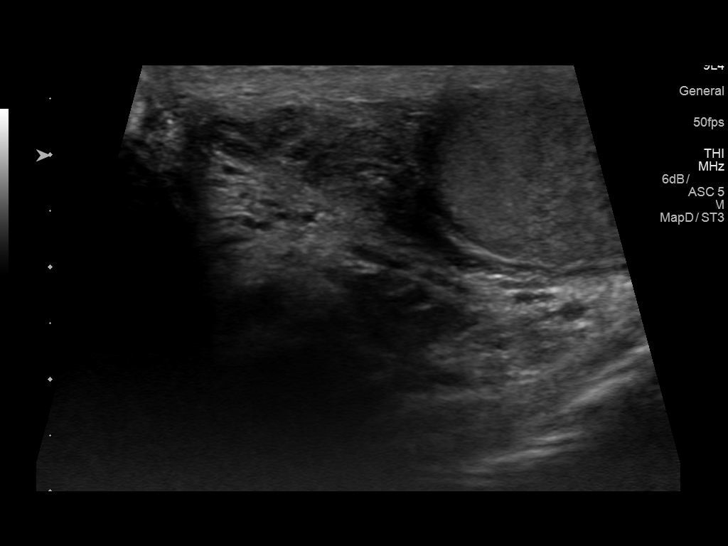
[im 62/68]
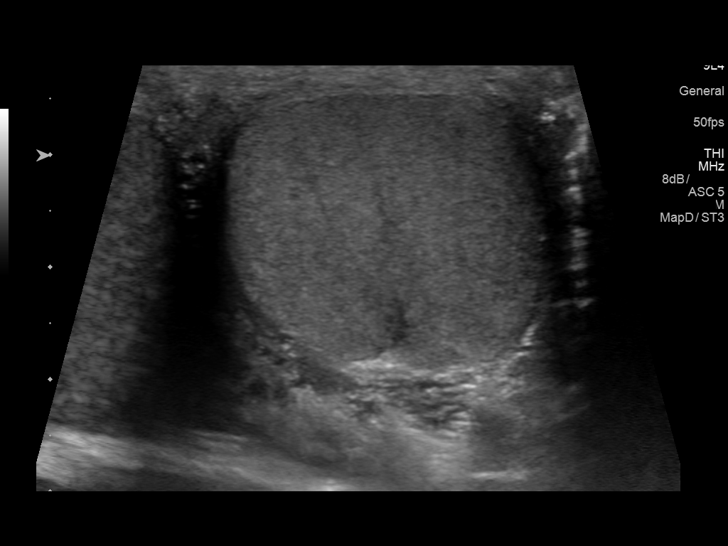
[im 68/68]
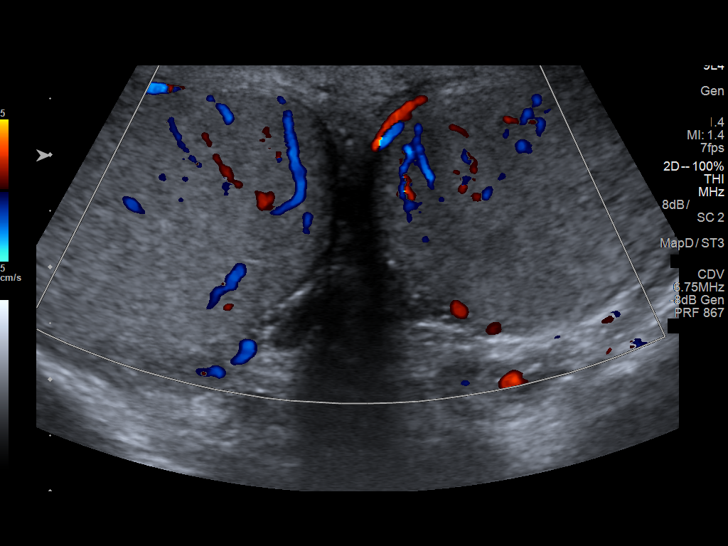

[13 of 25 positions shown; findings below may reference images not displayed]

FINDINGS: Right testicle

Measurements: 4.4 x 2.3 x 2.8 cm. No mass or microlithiasis
visualized. Normal blood flow. Homogeneous echogenicity.

Left testicle

Measurements: 4.3 x 2.3 x 2.8 cm. No mass or microlithiasis
visualized. Normal blood flow. Homogeneous echogenicity.

Right epididymis: Normal in size. Small epididymal head cyst
measures 5 mm.

Left epididymis:  Normal in size and appearance.

Hydrocele:  None visualized.

Varicocele:  None visualized.

Pulsed Doppler interrogation of both testes demonstrates normal low
resistance arterial and venous waveforms bilaterally.

No asymmetric scrotal skin thickening is noted.
IMPRESSION: Small right epididymal head cyst. Otherwise normal scrotal
ultrasound with Doppler. Normal blood flow without torsion.

## 2018-10-30 IMAGING — US US SCROTUM
1 series · 14 of 25 positions shown · non-contrast
Comparison: None available.

CLINICAL DATA: Initial evaluation for acute onset severe testicular
pain.

EXAM:
ULTRASOUND OF SCROTUM
TECHNIQUE: Complete ultrasound examination of the testicles, epididymis, and
other scrotal structures was performed.

[Series 1: us scrotum · 0.07mm/px · 14 of 83 slices shown]
[im 1/83]
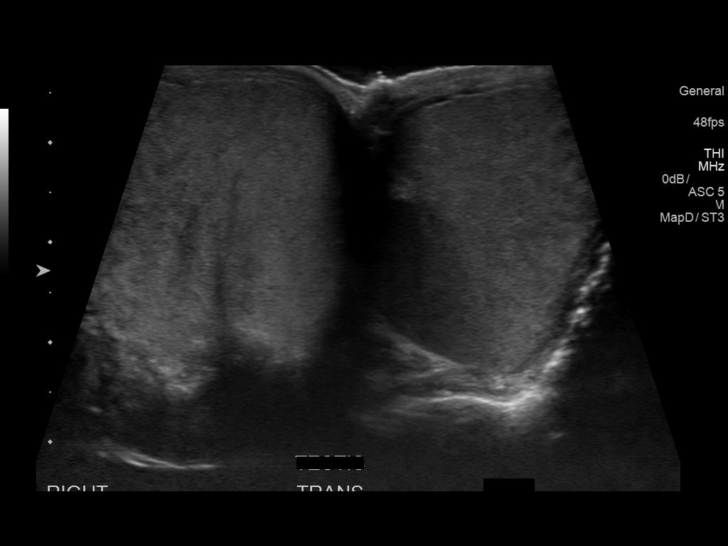
[im 7/83]
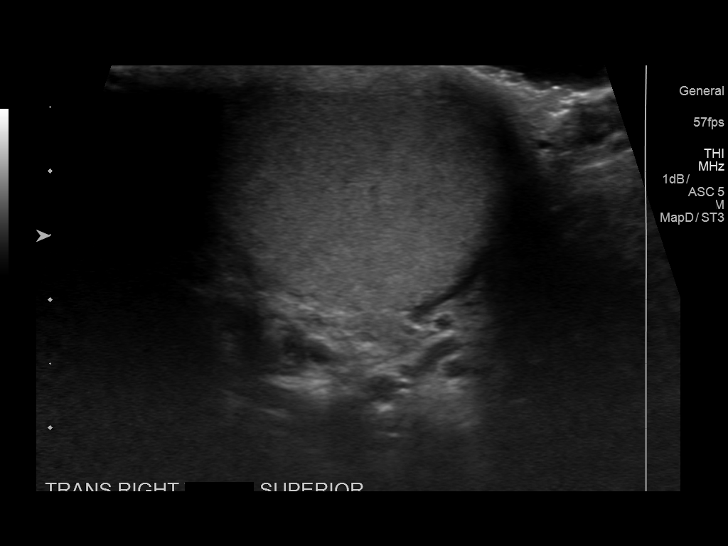
[im 14/83]
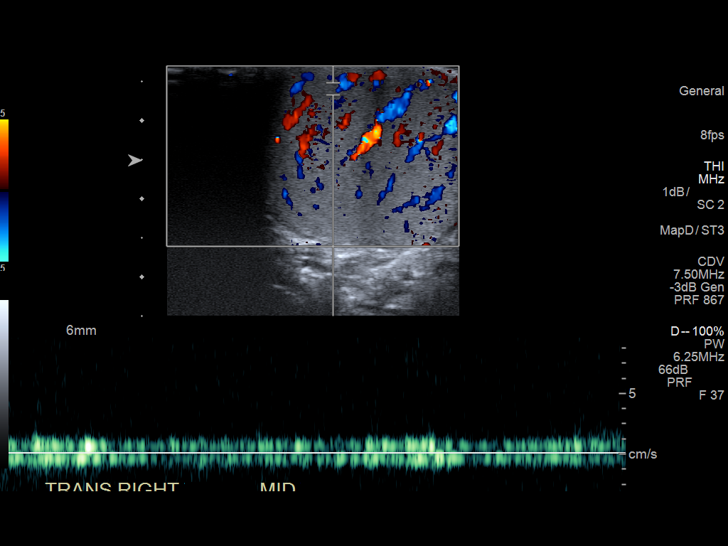
[im 21/83]
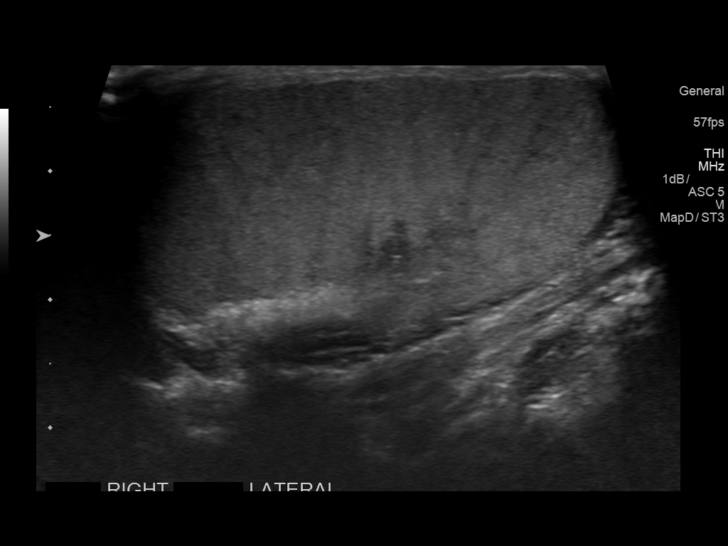
[im 28/83]
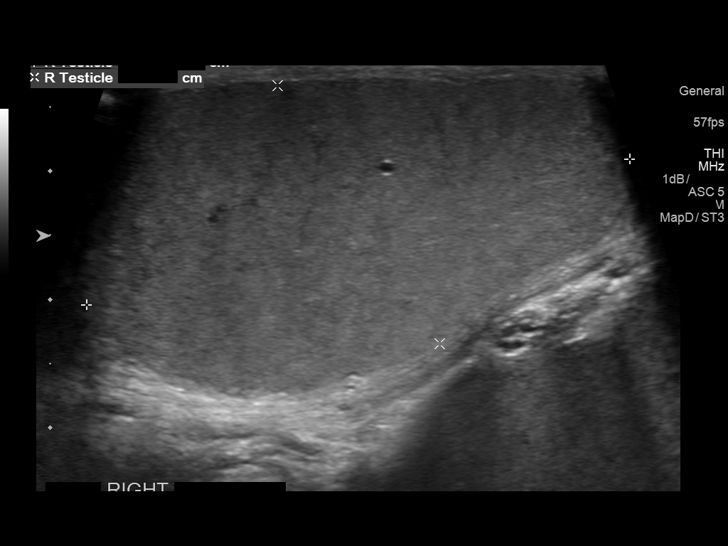
[im 31/83]
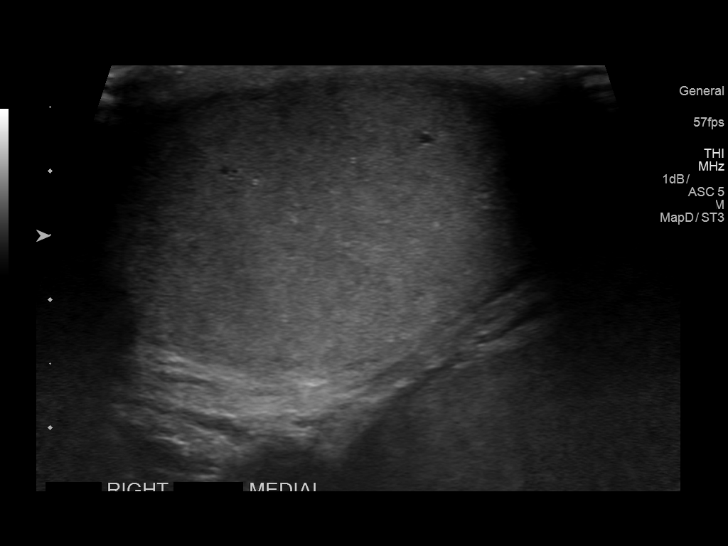
[im 38/83]
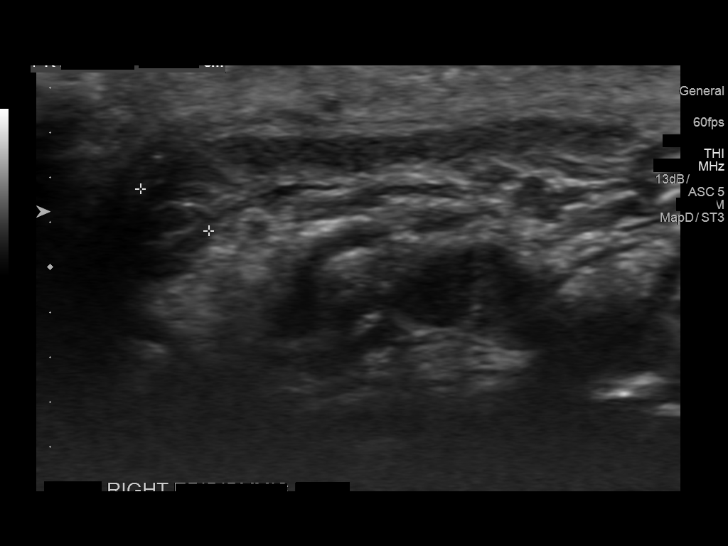
[im 45/83]
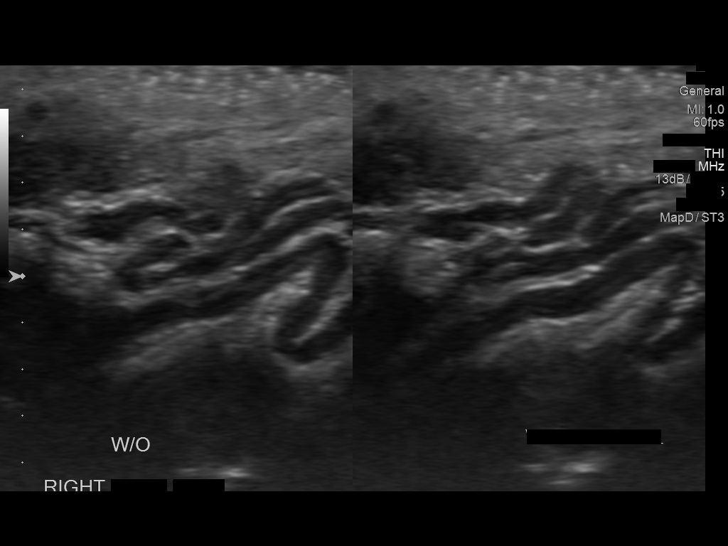
[im 52/83]
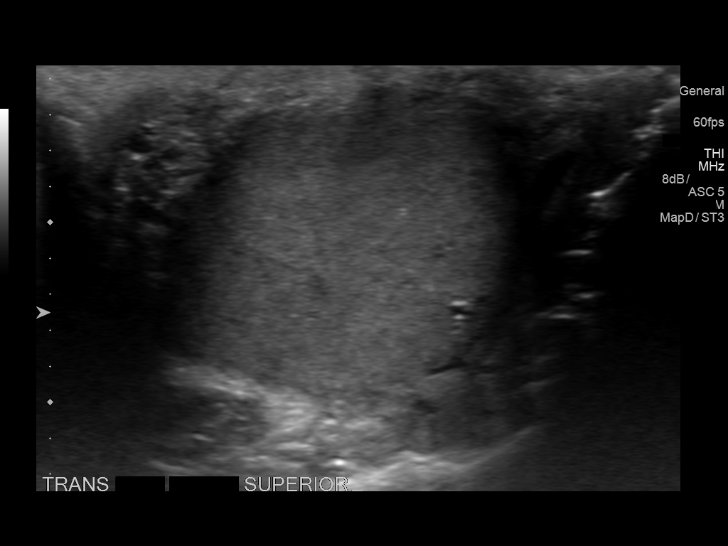
[im 55/83]
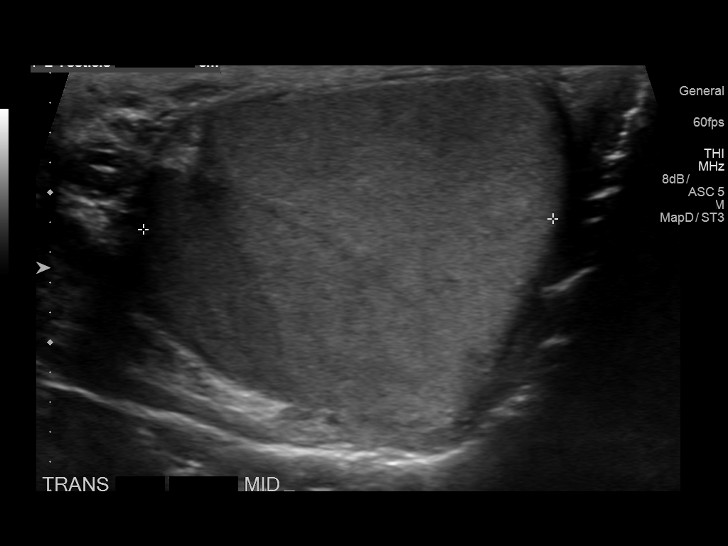
[im 62/83]
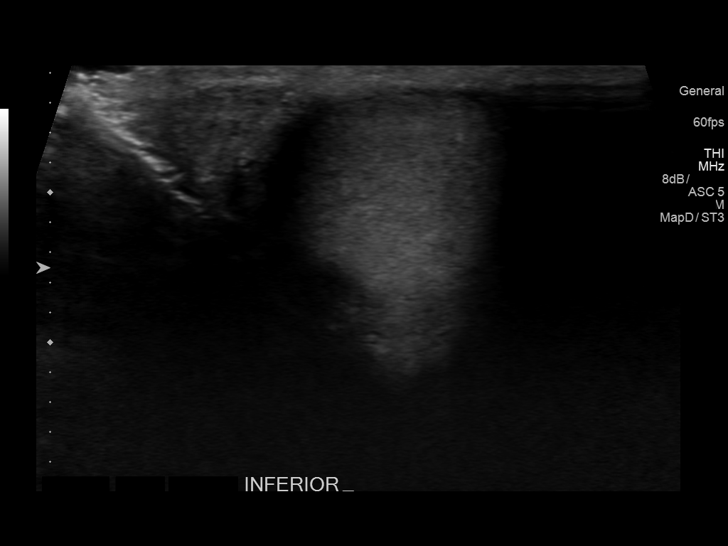
[im 69/83]
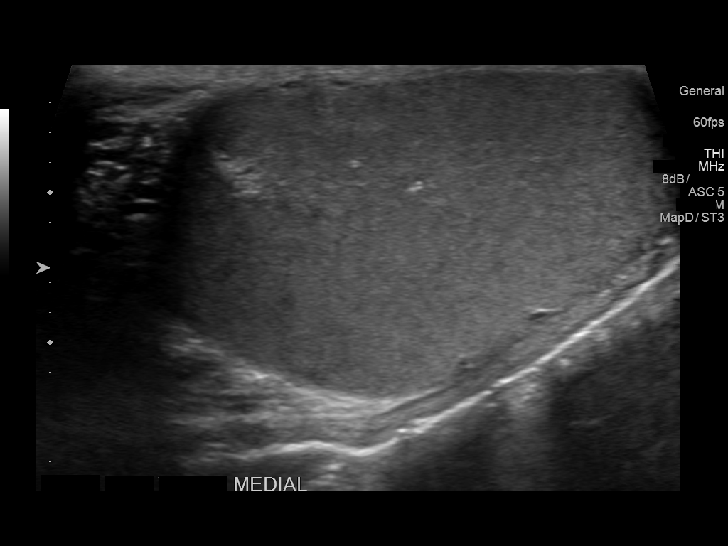
[im 76/83]
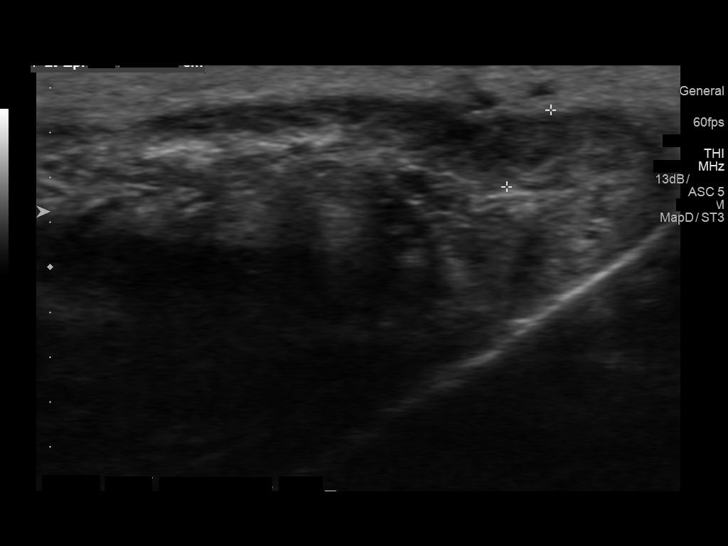
[im 83/83]
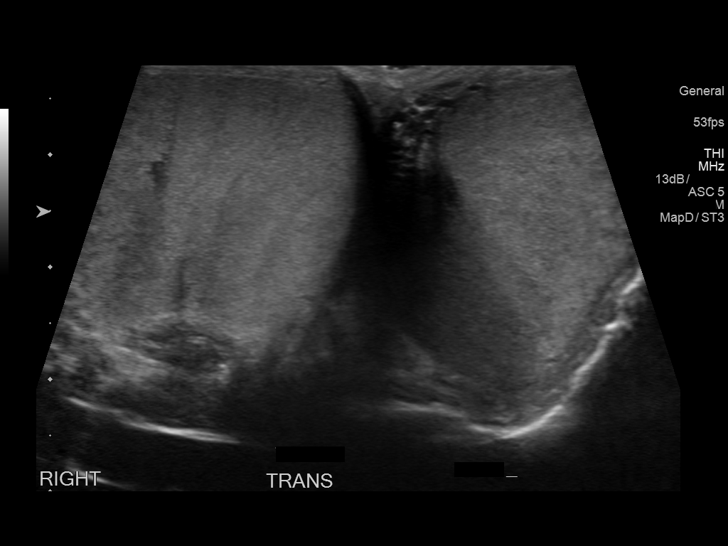

[14 of 25 positions shown; findings below may reference images not displayed]

FINDINGS: Right testicle

Measurements: 4.4 x 2.4 x 3.1 cm. No mass or microlithiasis
visualized.

Left testicle

Measurements: 4.1 x 2.2 x 2.8 cm. No mass or microlithiasis
visualized.

Right epididymis:  Normal in size and appearance.  The

Left epididymis:  Normal in size and appearance.

Hydrocele:  None visualized.

Varicocele:  None visualized.
IMPRESSION: Negative. No evidence for testicular mass or other significant
abnormality.

## 2019-04-13 IMAGING — US US ART/VEN ABD/PELV/SCROTUM DOPPLER LTD
1 series · 14 of 25 positions shown · non-contrast
Comparison: Scrotal ultrasound 02/26/2017, 02/16/2016

CLINICAL DATA: Left testicular pain.

EXAM:
SCROTAL ULTRASOUND
DOPPLER ULTRASOUND OF THE TESTICLES
TECHNIQUE: Complete ultrasound examination of the testicles, epididymis, and
other scrotal structures was performed. Color and spectral Doppler
ultrasound were also utilized to evaluate blood flow to the
testicles.

[Series 1: us art/ven abd/pelv/scrotum doppler ltd · 0.06mm/px · 14 of 52 slices shown]
[im 1/52]
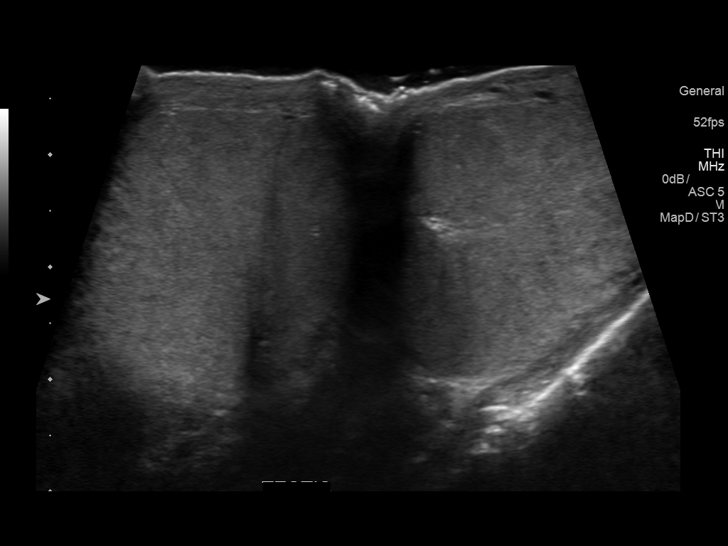
[im 5/52]
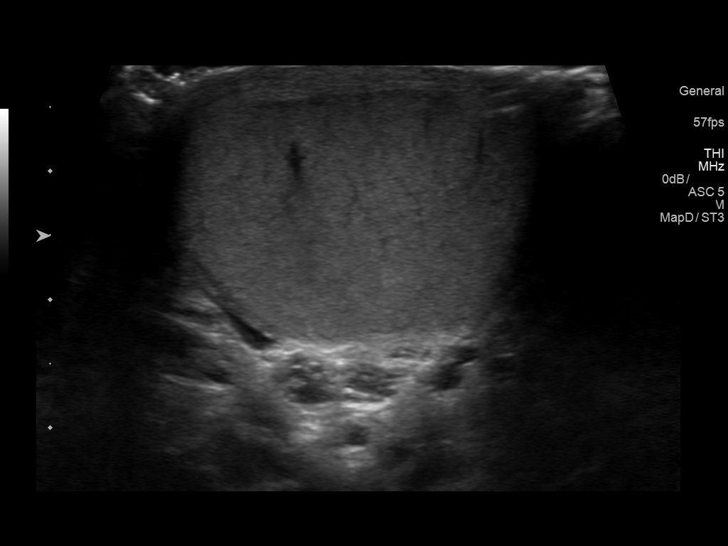
[im 9/52]
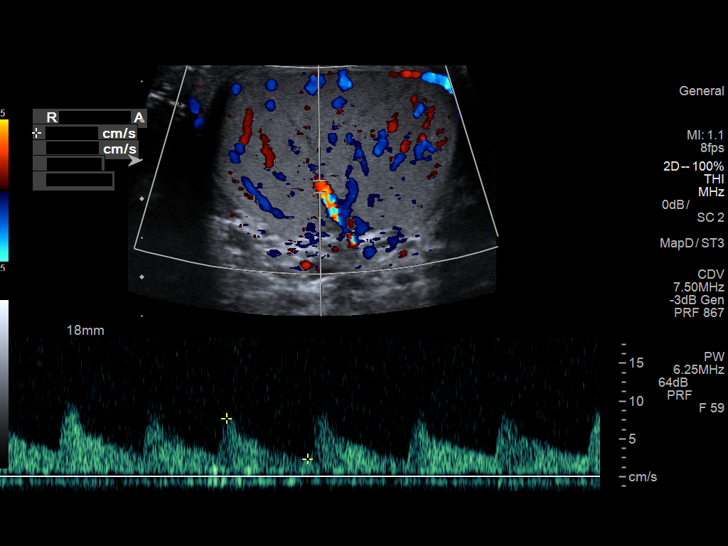
[im 13/52]
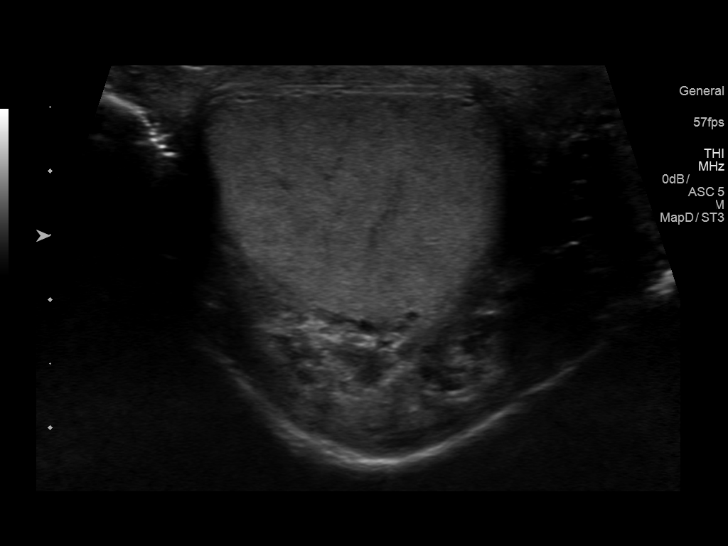
[im 18/52]
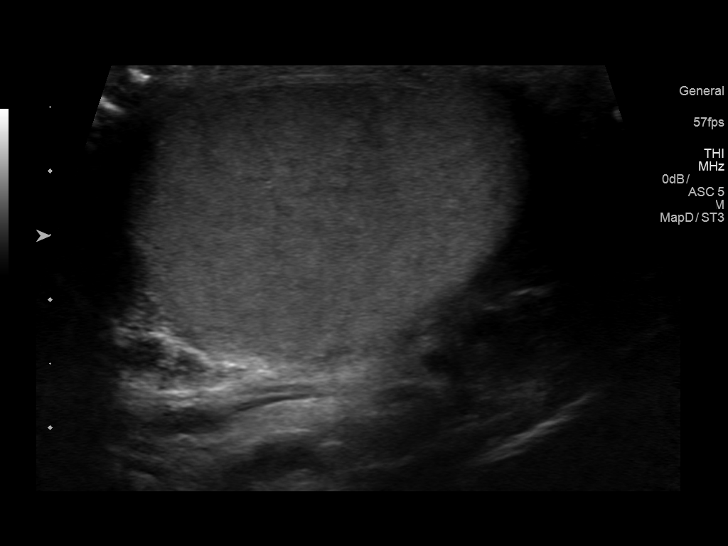
[im 20/52]
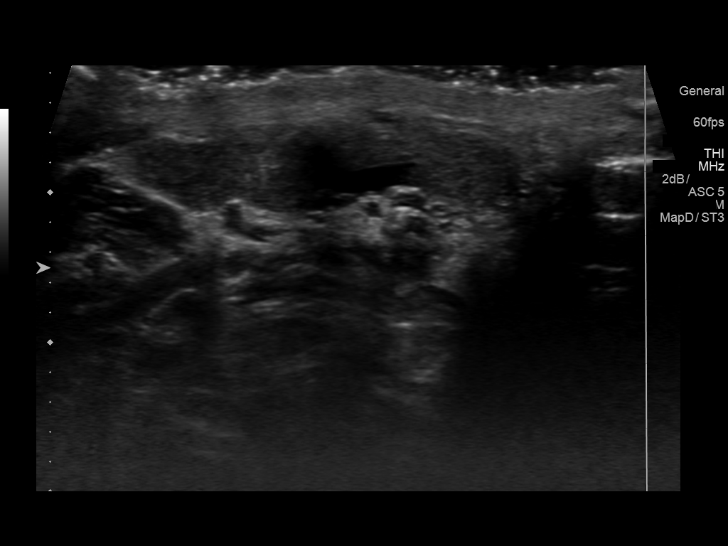
[im 24/52]
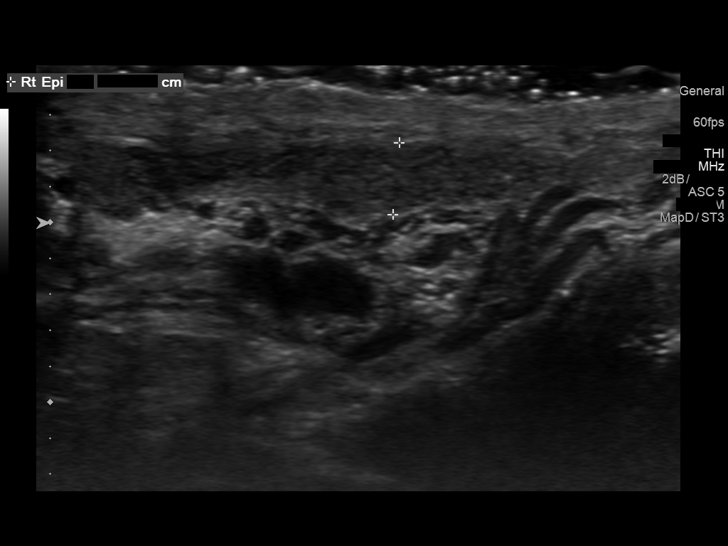
[im 28/52]
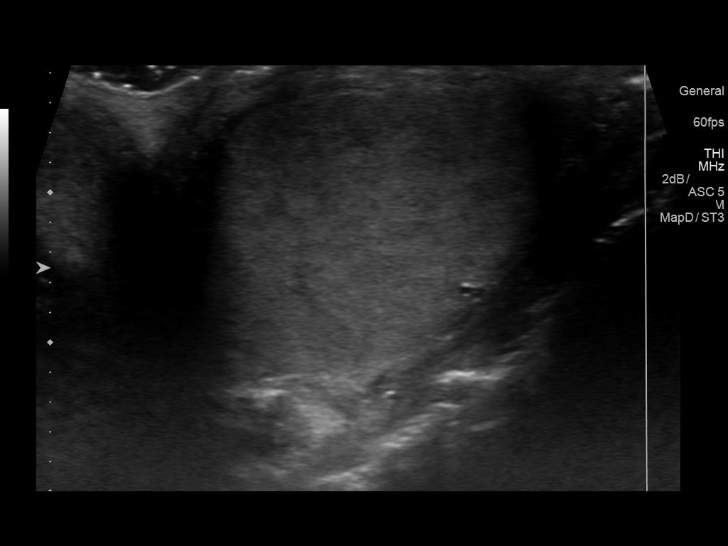
[im 32/52]
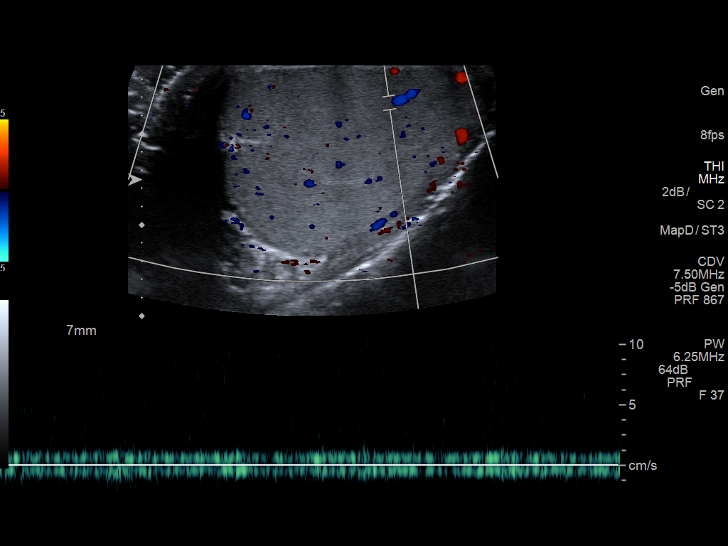
[im 35/52]
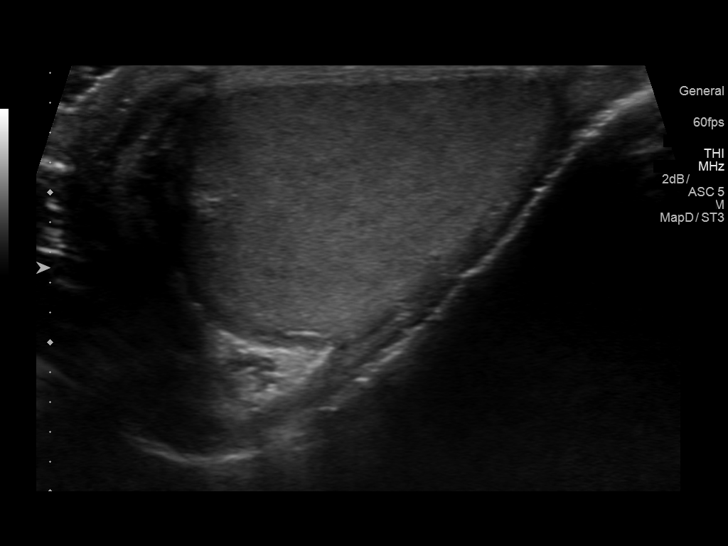
[im 39/52]
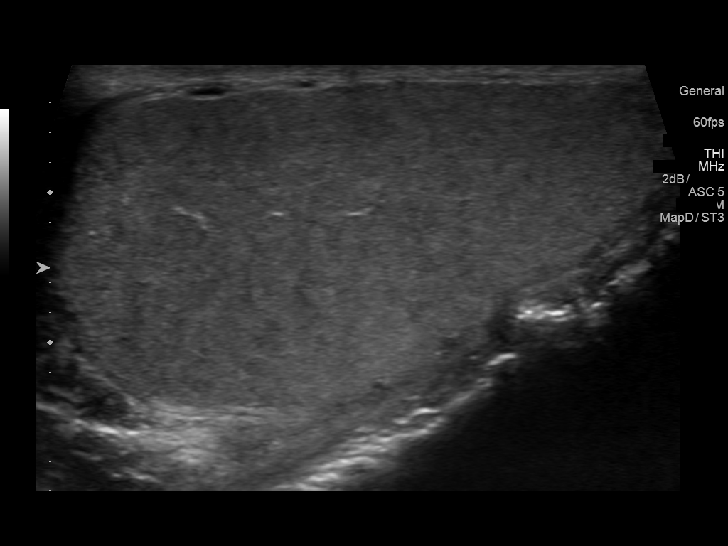
[im 43/52]
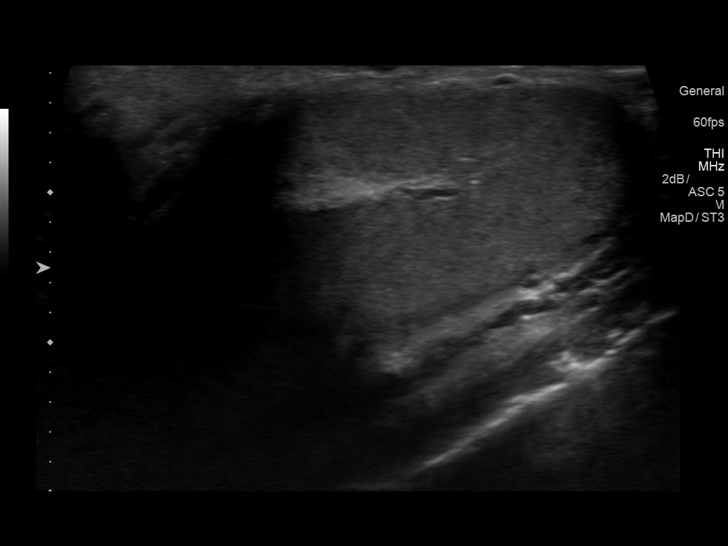
[im 47/52]
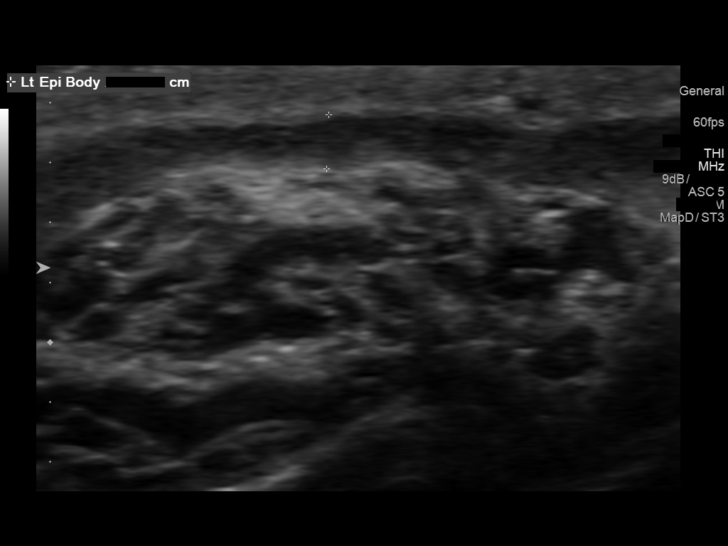
[im 52/52]
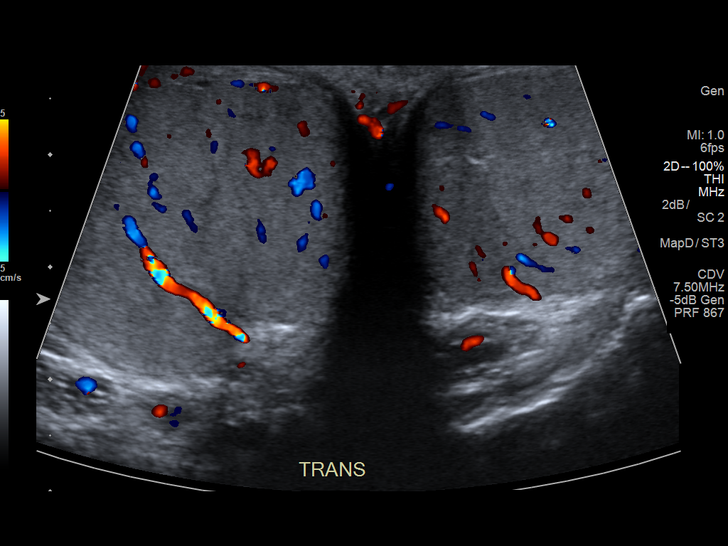

[14 of 25 positions shown; findings below may reference images not displayed]

FINDINGS: Right testicle

Measurements: 4.0 x 2.0 x 2.9 cm. Homogeneous echogenicity. Normal
blood flow. No mass or microlithiasis visualized.

Left testicle

Measurements: 4.0 x 2.1 x 2.7 cm. Homogeneous echogenicity. Normal
blood flow. No mass or microlithiasis visualized.

Right epididymis:  Normal in size and appearance.

Left epididymis:  Normal in size.  5 mm epididymal head cyst.

Hydrocele:  None visualized.

Varicocele:  None visualized.

Pulsed Doppler interrogation of both testes demonstrates normal low
resistance arterial and venous waveforms bilaterally.
IMPRESSION: Small left epididymal head cyst. Otherwise normal scrotal ultrasound
with Doppler.
